# Patient Record
Sex: Female | Born: 1970 | Race: Black or African American | Hispanic: No | Marital: Single | Smoking: Never smoker
Health system: Southern US, Community
[De-identification: ages and names within clinical notes are randomized; demographics above are authoritative.]

## PROBLEM LIST (undated history)

## (undated) DIAGNOSIS — J45909 Unspecified asthma, uncomplicated: Secondary | ICD-10-CM

---

## 1998-03-20 ENCOUNTER — Other Ambulatory Visit: Admission: RE | Admit: 1998-03-20 | Discharge: 1998-03-20 | Payer: Self-pay | Admitting: Gynecology

## 1999-07-22 ENCOUNTER — Inpatient Hospital Stay (HOSPITAL_COMMUNITY): Admission: AD | Admit: 1999-07-22 | Discharge: 1999-07-22 | Payer: Self-pay | Admitting: Obstetrics & Gynecology

## 1999-10-13 ENCOUNTER — Observation Stay (HOSPITAL_COMMUNITY): Admission: AD | Admit: 1999-10-13 | Discharge: 1999-10-13 | Payer: Self-pay | Admitting: Obstetrics and Gynecology

## 1999-11-15 ENCOUNTER — Inpatient Hospital Stay (HOSPITAL_COMMUNITY): Admission: AD | Admit: 1999-11-15 | Discharge: 1999-11-15 | Payer: Self-pay | Admitting: Obstetrics and Gynecology

## 2000-01-28 ENCOUNTER — Inpatient Hospital Stay (HOSPITAL_COMMUNITY): Admission: AD | Admit: 2000-01-28 | Discharge: 2000-02-01 | Payer: Self-pay | Admitting: Obstetrics and Gynecology

## 2000-02-02 ENCOUNTER — Encounter: Admission: RE | Admit: 2000-02-02 | Discharge: 2000-05-02 | Payer: Self-pay | Admitting: Obstetrics and Gynecology

## 2000-05-04 ENCOUNTER — Encounter: Admission: RE | Admit: 2000-05-04 | Discharge: 2000-08-02 | Payer: Self-pay | Admitting: Obstetrics and Gynecology

## 2000-08-04 ENCOUNTER — Encounter: Admission: RE | Admit: 2000-08-04 | Discharge: 2000-11-02 | Payer: Self-pay | Admitting: Obstetrics and Gynecology

## 2000-11-04 ENCOUNTER — Encounter: Admission: RE | Admit: 2000-11-04 | Discharge: 2000-12-04 | Payer: Self-pay | Admitting: Obstetrics and Gynecology

## 2001-01-29 ENCOUNTER — Emergency Department (HOSPITAL_COMMUNITY): Admission: EM | Admit: 2001-01-29 | Discharge: 2001-01-29 | Payer: Self-pay | Admitting: Internal Medicine

## 2001-02-01 ENCOUNTER — Encounter: Admission: RE | Admit: 2001-02-01 | Discharge: 2001-03-03 | Payer: Self-pay | Admitting: Obstetrics and Gynecology

## 2001-03-04 ENCOUNTER — Encounter: Admission: RE | Admit: 2001-03-04 | Discharge: 2001-04-03 | Payer: Self-pay | Admitting: Obstetrics and Gynecology

## 2001-05-04 ENCOUNTER — Encounter: Admission: RE | Admit: 2001-05-04 | Discharge: 2001-06-03 | Payer: Self-pay | Admitting: Obstetrics and Gynecology

## 2001-06-04 ENCOUNTER — Encounter: Admission: RE | Admit: 2001-06-04 | Discharge: 2001-07-04 | Payer: Self-pay | Admitting: Obstetrics and Gynecology

## 2005-02-07 ENCOUNTER — Other Ambulatory Visit: Admission: RE | Admit: 2005-02-07 | Discharge: 2005-02-07 | Payer: Self-pay | Admitting: Obstetrics and Gynecology

## 2005-02-25 ENCOUNTER — Encounter: Admission: RE | Admit: 2005-02-25 | Discharge: 2005-02-25 | Payer: Self-pay | Admitting: Obstetrics and Gynecology

## 2010-10-17 ENCOUNTER — Encounter: Payer: Self-pay | Admitting: Obstetrics and Gynecology

## 2019-05-25 ENCOUNTER — Other Ambulatory Visit: Payer: Self-pay

## 2019-05-25 ENCOUNTER — Encounter (HOSPITAL_BASED_OUTPATIENT_CLINIC_OR_DEPARTMENT_OTHER): Payer: Self-pay | Admitting: *Deleted

## 2019-05-25 ENCOUNTER — Emergency Department (HOSPITAL_BASED_OUTPATIENT_CLINIC_OR_DEPARTMENT_OTHER)
Admission: EM | Admit: 2019-05-25 | Discharge: 2019-05-26 | Disposition: A | Discharge status: Home / Self Care | Payer: No Typology Code available for payment source | Attending: Emergency Medicine | Admitting: Emergency Medicine

## 2019-05-25 DIAGNOSIS — R600 Localized edema: Secondary | ICD-10-CM

## 2019-05-25 DIAGNOSIS — J45909 Unspecified asthma, uncomplicated: Secondary | ICD-10-CM | POA: Diagnosis not present

## 2019-05-25 DIAGNOSIS — R2242 Localized swelling, mass and lump, left lower limb: Secondary | ICD-10-CM | POA: Insufficient documentation

## 2019-05-25 HISTORY — DX: Unspecified asthma, uncomplicated: J45.909

## 2019-05-25 NOTE — ED Triage Notes (Signed)
Pt reports left foot pain and swelling since Wednesday. Foot appears swollen

## 2019-05-26 ENCOUNTER — Emergency Department (HOSPITAL_BASED_OUTPATIENT_CLINIC_OR_DEPARTMENT_OTHER): Payer: No Typology Code available for payment source

## 2019-05-26 MED ORDER — PREDNISONE 50 MG PO TABS: ORAL_TABLET | ORAL | 0 refills | Status: DC

## 2019-05-26 NOTE — ED Notes (Signed)
X RAY at bedside 

## 2019-05-26 NOTE — ED Notes (Signed)
Updated patient on wait times, thanked for her patience.

## 2019-05-26 NOTE — ED Notes (Signed)
PMS intact before and after. Pt tolerated well. All questions answered. 

## 2019-05-26 NOTE — ED Provider Notes (Signed)
[facility] EMERGENCY DEPARTMENT Provider Note   CSN: [id number] Arrival date & time: 05/25/19  2227     History   Chief Complaint Chief Complaint  Patient presents with  . Leg Swelling    HPI Elizabeth Ellis is a 48 y.o. female.     The history is provided by the patient.  Foot Pain This is a new problem. The current episode started more than 2 days ago. The problem occurs daily. The problem has been gradually worsening. Pertinent negatives include no chest pain and no shortness of breath. The symptoms are aggravated by walking. The symptoms are relieved by rest.  Patient reports left foot pain and swelling No trauma, but she did trip recently may have injured it then.  No fevers or vomiting.  No body aches.  No chest pain or shortness of breath.  No calf pain or tenderness.  She reports long history of bilateral lower extremity swelling usually related to food intake.  However isolated foot swelling is unusual for her. No history of VTE She does not take oral contraceptives  She is a Non-smoker Past Medical History:  Diagnosis Date  . Asthma     There are no active problems to display for this patient.   History reviewed. No pertinent surgical history.   OB History   No obstetric history on file.      Home Medications    Prior to Admission medications   Not on File    Family History No family history on file.  Social History Social History   Tobacco Use  . Smoking status: Never Smoker  . Smokeless tobacco: Never Used  Substance Use Topics  . Alcohol use: Never    Frequency: Never  . Drug use: Never     Allergies   Sulfa antibiotics   Review of Systems Review of Systems  Constitutional: Negative for fever.  Respiratory: Negative for shortness of breath.   Cardiovascular: Negative for chest pain.  Musculoskeletal: Positive for arthralgias.  All other systems reviewed and are negative.    Physical Exam Updated Vital Signs BP  135/86 (BP Location: Left Arm)   Pulse 86   Temp 99.1 F (37.3 C) (Oral)   Resp 18   Ht 1.575 m (5\' 2" )   Wt 81.6 kg   LMP 05/11/2019 (Approximate)   SpO2 100%   BMI 32.92 kg/m   Physical Exam CONSTITUTIONAL: Well developed/well nourished HEAD: Normocephalic/atraumatic EYES: EOMI/PERRL NECK: supple no meningeal signs SPINE/BACK:entire spine nontender CV: S1/S2 noted, no murmurs/rubs/gallops noted LUNGS: Lungs are clear to auscultation bilaterally, no apparent distress ABDOMEN: soft, nontender NEURO: Pt is awake/alert/appropriate, moves all extremitiesx4.  No facial droop.   EXTREMITIES: pulses normal/equal, full ROM Left foot is edematous questionable bruising.  No crepitus. no  Erythema.  There are no puncture wounds noted to the foot or in the webspaces.  Plantar surface is nontender.  Diffuse tenderness to dorsal aspect of left foot No ankle tenderness.  Distal pulses intact.  There is no calf tenderness in either leg.  No tenderness to either calf or  thigh SKIN: warm, color normal PSYCH: no abnormalities of mood noted, alert and oriented to situation   ED Treatments / Results  Labs (all labs ordered are listed, but only abnormal results are displayed) Labs Reviewed - No data to display  EKG None  Radiology Dg Foot Complete Left  Result Date: 05/26/2019 CLINICAL DATA:  Foot pain for 4 days EXAM: LEFT FOOT - COMPLETE 3+ VIEW  COMPARISON:  None. FINDINGS: There is no evidence of fracture or dislocation. There is no evidence of arthropathy. Small plantar calcaneal spur. Dorsal soft tissue swelling IMPRESSION: No acute osseous abnormality Electronically Signed   By: Donavan Foil M.D.   On: 05/26/2019 01:34    Procedures Procedures    Medications Ordered in ED Medications - No data to display   Initial Impression / Assessment and Plan / ED Course  I have reviewed the triage vital signs and the nursing notes.  Pertinent  imaging results that were available during  my care of the patient were reviewed by me and considered in my medical decision making (see chart for details).       2:07 AM  Presents with isolated left foot swelling without known injury. X-ray reviewed and is negative.  There is no erythema or signs of abscess to suggest infection. There is no crepitus. I have low suspicion for DVT as there is no calf tenderness or edema. Potentially an arthritic condition. I feel to reasonable to start on a course of steroids and also keep nonweightbearing for several days and crutches.  She will  referred to sports medicine if no improvement in a week. If there is worsening pain, swelling, redness or swelling into her legs she will need to return to the ER Patient declines pain  Final Clinical Impressions(s) / ED Diagnoses   Final diagnoses:  Edema of left foot    ED Discharge Orders         Ordered    predniSONE (DELTASONE) 50 MG tablet     05/26/19 0202           Ripley Fraise, MD 05/26/19 0207

## 2019-05-31 ENCOUNTER — Other Ambulatory Visit: Payer: Self-pay

## 2019-05-31 ENCOUNTER — Ambulatory Visit (INDEPENDENT_AMBULATORY_CARE_PROVIDER_SITE_OTHER): Payer: No Typology Code available for payment source | Admitting: Family Medicine

## 2019-05-31 ENCOUNTER — Ambulatory Visit: Payer: Self-pay

## 2019-05-31 ENCOUNTER — Encounter: Payer: Self-pay | Admitting: Family Medicine

## 2019-05-31 VITALS — BP 144/85 | Ht 62.0 in | Wt 180.0 lb

## 2019-05-31 DIAGNOSIS — M79672 Pain in left foot: Secondary | ICD-10-CM

## 2019-05-31 MED ORDER — PENNSAID 2 % TD SOLN: 1.0000 "application " | Freq: Two times a day (BID) | TRANSDERMAL | 3 refills | Status: AC

## 2019-05-31 MED ORDER — RAYOS 5 MG PO TBEC: 10.0000 mg | DELAYED_RELEASE_TABLET | Freq: Every day | ORAL | 1 refills | Status: AC

## 2019-05-31 NOTE — Progress Notes (Addendum)
Elizabeth Ellis - 48 y.o. female MRN [id number]  Date of birth: 1971/01/24  SUBJECTIVE:  Including CC & ROS.  Chief Complaint  Patient presents with  . Foot Pain    left foot x 1.5 weeks    Elizabeth Ellis is a 48 y.o. female that is presenting with left foot pain and swelling.  The pain is been ongoing for roughly 1 week.  This is acute in nature.  She denies any specific inciting event.  The pain is most severe over the dorsal midfoot and radiates to the toes.  She was seen in the emergency department and had improvement while she was taking the prednisone.  The pain seemed to return while she was off of this.  She has a history of foot pain in both feet but this seems worse.  She has not been walking on a regular occurrence.  No history of stress fracture.  Unsure if any family history of any autoimmune disease.  No history of gout.  Denies any new or different medications.  Denies any history of blood clots.  She denies any calf pain  Independent review of the left foot x-ray from 8/30 shows no acute abnormality.   Review of Systems  Constitutional: Negative for fever.  HENT: Negative for congestion.   Respiratory: Negative for cough.   Cardiovascular: Negative for chest pain.  Gastrointestinal: Negative for abdominal pain.  Musculoskeletal: Positive for gait problem and joint swelling.  Skin: Negative for color change.  Neurological: Negative for weakness.  Hematological: Negative for adenopathy.    HISTORY: Past Medical, Surgical, Social, and Family History Reviewed & Updated per EMR.   Pertinent Historical Findings include:  Past Medical History:  Diagnosis Date  . Asthma     No past surgical history on file.  Allergies  Allergen Reactions  . Sulfa Antibiotics Photosensitivity    No family history on file.   Social History   Socioeconomic History  . Marital status: Single    Spouse name: Not on file  . Number of children: Not on file  . Years of education:  Not on file  . Highest education level: Not on file  Occupational History  . Not on file  Social Needs  . Financial resource strain: Not on file  . Food insecurity    Worry: Not on file    Inability: Not on file  . Transportation needs    Medical: Not on file    Non-medical: Not on file  Tobacco Use  . Smoking status: Never Smoker  . Smokeless tobacco: Never Used  Substance and Sexual Activity  . Alcohol use: Never    Frequency: Never  . Drug use: Never  . Sexual activity: Not on file  Lifestyle  . Physical activity    Days per week: Not on file    Minutes per session: Not on file  . Stress: Not on file  Relationships  . Social Herbalist on phone: Not on file    Gets together: Not on file    Attends religious service: Not on file    Active member of club or organization: Not on file    Attends meetings of clubs or organizations: Not on file    Relationship status: Not on file  . Intimate partner violence    Fear of current or ex partner: Not on file    Emotionally abused: Not on file    Physically abused: Not on file    Forced  sexual activity: Not on file  Other Topics Concern  . Not on file  Social History Narrative  . Not on file     PHYSICAL EXAM:  VS: BP (!) 144/85   Ht '5\' 2"'  (1.575 m)   Wt 180 lb (81.6 kg)   LMP 05/11/2019 (Approximate)   BMI 32.92 kg/m  Physical Exam Gen: NAD, alert, cooperative with exam, well-appearing ENT: normal lips, normal nasal mucosa,  Eye: normal EOM, normal conjunctiva and lids CV:   +2 pedal pulses   Resp: no accessory muscle use, non-labored,  Skin: no rashes, no areas of induration  Neuro: normal tone, normal sensation to touch Psych:  normal insight, alert and oriented MSK:  Left foot: Obvious soft tissue swelling of the dorsal midfoot. Normal ankle range of motion. Normal toe range of motion. No redness or swelling of the MTP joints. Tenderness to palpation over the tarsometatarsal joints. No  ecchymosis. Neurovascular intact  Limited ultrasound: Left foot:  Obvious soft tissue swelling over the dorsal midfoot.  There is degenerative changes through the tarsometatarsal joints.  But no significant effusion in the joints. Cystic structure in the dorsal midfoot just superior to the tarsometatarsal joint between the second and first metatarsal. No significant synovitis over the first or second MTP joints. No crystalline deposition. No effusion within the ankle joint. Normal-appearing posterior tibialis  Summary: Significant soft tissue swelling throughout the foot and cystic structure change of the dorsal midfoot.  Unclear if this is reactionary versus rheumatologic  Ultrasound and interpretation by Clearance Coots, MD      ASSESSMENT & PLAN:   Left foot pain Acutely having left foot pain and swelling.  Less likely for stress fracture.  Does not appear to be gout related on ultrasound.  Less likely for synovitis with no changes observed on ultrasound.  She does have a cystic structure in the middle the foot and unclear if this is a source or not.  Less likely for blood clot.  She did get improvement with the prednisone previously -Rayos and sample provided. -Uric acid, ANA, ESR and CRP. -Cam walker. - may need to consider doppler or MRI if no improvement.

## 2019-05-31 NOTE — Assessment & Plan Note (Signed)
Acutely having left foot pain and swelling.  Less likely for stress fracture.  Does not appear to be gout related on ultrasound.  Less likely for synovitis with no changes observed on ultrasound.  She does have a cystic structure in the middle the foot and unclear if this is a source or not.  Less likely for blood clot.  She did get improvement with the prednisone previously -Rayos and sample provided. -Uric acid, ANA, ESR and CRP. -Cam walker. - may need to consider doppler or MRI if no improvement.

## 2019-05-31 NOTE — Progress Notes (Signed)
Medication Samples have been provided to the patient.  Drug name: Rayos      Strength: 5 mg        Qty: 2 Boxes  LOT: [id number]  Exp.Date: 01/2020  Dosing instructions: Take 2 tablets at bedtime  The patient has been instructed regarding the correct time, dose, and frequency of taking this medication, including desired effects and most common side effects.   Sherrie George, [place] 9:34 AM 05/31/2019

## 2019-05-31 NOTE — Patient Instructions (Signed)
Nice to meet you I will call you with the results from today  Please take 10 my of the Rayos until we see you back  Please elevate your foot.  I will call you with the results from today   Please send me a message in MyChart with any questions or updates.  Please see me back in 2 weeks.   --Dr. Raeford Razor

## 2019-06-03 LAB — ANA,IFA RA DIAG PNL W/RFLX TIT/PATN
ANA Titer 1: NEGATIVE
Cyclic Citrullin Peptide Ab: 3 units (ref 0–19)
Rheumatoid fact SerPl-aCnc: <10 IU/mL (ref 0.0–13.9)

## 2019-06-03 LAB — SEDIMENTATION RATE: Sed Rate: 26 mm/hr (ref 0–32)

## 2019-06-03 LAB — URIC ACID: Uric Acid: 4.6 mg/dL (ref 2.5–7.1)

## 2019-06-03 LAB — C-REACTIVE PROTEIN: CRP: <1 mg/L (ref 0–10)

## 2019-06-04 ENCOUNTER — Telehealth: Payer: Self-pay | Admitting: Family Medicine

## 2019-06-04 NOTE — Telephone Encounter (Signed)
Left VM for patient. If she calls back please have her speak with a nurse/CMA and inform that her results are normal.   If any questions then please take the best time and phone number to call and I will try to call her back.   Rosemarie Ax, MD [facility] 06/04/2019, 8:22 AM

## 2019-06-14 ENCOUNTER — Ambulatory Visit: Payer: No Typology Code available for payment source | Admitting: Family Medicine

## 2019-06-14 NOTE — Progress Notes (Deleted)
  Elizabeth Ellis - 48 y.o. female MRN [id number]  Date of birth: 1971/08/14  SUBJECTIVE:  Including CC & ROS.  No chief complaint on file.   Elizabeth Ellis is a 48 y.o. female that is  ***.  ***   Review of Systems  HISTORY: Past Medical, Surgical, Social, and Family History Reviewed & Updated per EMR.   Pertinent Historical Findings include:  Past Medical History:  Diagnosis Date  . Asthma     No past surgical history on file.  Allergies  Allergen Reactions  . Sulfa Antibiotics Photosensitivity    No family history on file.   Social History   Socioeconomic History  . Marital status: Single    Spouse name: Not on file  . Number of children: Not on file  . Years of education: Not on file  . Highest education level: Not on file  Occupational History  . Not on file  Social Needs  . Financial resource strain: Not on file  . Food insecurity    Worry: Not on file    Inability: Not on file  . Transportation needs    Medical: Not on file    Non-medical: Not on file  Tobacco Use  . Smoking status: Never Smoker  . Smokeless tobacco: Never Used  Substance and Sexual Activity  . Alcohol use: Never    Frequency: Never  . Drug use: Never  . Sexual activity: Not on file  Lifestyle  . Physical activity    Days per week: Not on file    Minutes per session: Not on file  . Stress: Not on file  Relationships  . Social Herbalist on phone: Not on file    Gets together: Not on file    Attends religious service: Not on file    Active member of club or organization: Not on file    Attends meetings of clubs or organizations: Not on file    Relationship status: Not on file  . Intimate partner violence    Fear of current or ex partner: Not on file    Emotionally abused: Not on file    Physically abused: Not on file    Forced sexual activity: Not on file  Other Topics Concern  . Not on file  Social History Narrative  . Not on file     PHYSICAL EXAM:   VS: There were no vitals taken for this visit. Physical Exam Gen: NAD, alert, cooperative with exam, well-appearing ENT: normal lips, normal nasal mucosa,  Eye: normal EOM, normal conjunctiva and lids CV:  no edema, +2 pedal pulses   Resp: no accessory muscle use, non-labored,  GI: no masses or tenderness, no hernia  Skin: no rashes, no areas of induration  Neuro: normal tone, normal sensation to touch Psych:  normal insight, alert and oriented MSK:  ***      ASSESSMENT & PLAN:   No problem-specific Assessment & Plan notes found for this encounter.

## 2019-06-17 ENCOUNTER — Encounter: Payer: Self-pay | Admitting: Family Medicine

## 2019-06-17 ENCOUNTER — Other Ambulatory Visit: Payer: Self-pay

## 2019-06-17 ENCOUNTER — Ambulatory Visit (INDEPENDENT_AMBULATORY_CARE_PROVIDER_SITE_OTHER): Payer: No Typology Code available for payment source | Admitting: Family Medicine

## 2019-06-17 VITALS — BP 126/77 | Ht 62.0 in | Wt 180.0 lb

## 2019-06-17 DIAGNOSIS — M79672 Pain in left foot: Secondary | ICD-10-CM | POA: Diagnosis not present

## 2019-06-17 NOTE — Progress Notes (Signed)
Elizabeth Ellis - 48 y.o. female MRN [id number]  Date of birth: 1970/11/29  SUBJECTIVE:  Including CC & ROS.  Chief Complaint  Patient presents with  . Follow-up    follow up for left foot    Elizabeth Ellis is a 48 y.o. female that is following up for her left dorsal foot pain.  Pain is still ongoing.  The pain is gotten somewhat improved with the cam walker.  She was initially walking when the pain occurred.  She also had an injury a few years ago.  There was swelling as well as significant pain on the dorsal aspect with any walking prior to her in the cam walker.  Has some shooting radiating pain distally to the toes.  Has been out of the cam walker for a few hours around the house.  No ecchymosis.  Still has swelling intermittently.   Review of Systems  Constitutional: Negative for fever.  HENT: Negative for congestion.   Respiratory: Negative for cough.   Cardiovascular: Negative for chest pain.  Gastrointestinal: Negative for abdominal pain.  Musculoskeletal: Positive for gait problem.  Skin: Negative for color change.  Neurological: Negative for weakness.  Hematological: Negative for adenopathy.    HISTORY: Past Medical, Surgical, Social, and Family History Reviewed & Updated per EMR.   Pertinent Historical Findings include:  Past Medical History:  Diagnosis Date  . Asthma     No past surgical history on file.  Allergies  Allergen Reactions  . Sulfa Antibiotics Photosensitivity    No family history on file.   Social History   Socioeconomic History  . Marital status: Single    Spouse name: Not on file  . Number of children: Not on file  . Years of education: Not on file  . Highest education level: Not on file  Occupational History  . Not on file  Social Needs  . Financial resource strain: Not on file  . Food insecurity    Worry: Not on file    Inability: Not on file  . Transportation needs    Medical: Not on file    Non-medical: Not on file  Tobacco  Use  . Smoking status: Never Smoker  . Smokeless tobacco: Never Used  Substance and Sexual Activity  . Alcohol use: Never    Frequency: Never  . Drug use: Never  . Sexual activity: Not on file  Lifestyle  . Physical activity    Days per week: Not on file    Minutes per session: Not on file  . Stress: Not on file  Relationships  . Social Musician on phone: Not on file    Gets together: Not on file    Attends religious service: Not on file    Active member of club or organization: Not on file    Attends meetings of clubs or organizations: Not on file    Relationship status: Not on file  . Intimate partner violence    Fear of current or ex partner: Not on file    Emotionally abused: Not on file    Physically abused: Not on file    Forced sexual activity: Not on file  Other Topics Concern  . Not on file  Social History Narrative  . Not on file     PHYSICAL EXAM:  VS: BP 126/77   Ht 5\' 2"  (1.575 m)   Wt 180 lb (81.6 kg)   BMI 32.92 kg/m  Physical Exam Gen: NAD, alert, cooperative  with exam, well-appearing ENT: normal lips, normal nasal mucosa,  Eye: normal EOM, normal conjunctiva and lids CV:  no edema, +2 pedal pulses   Resp: no accessory muscle use, non-labored,  Skin: no rashes, no areas of induration  Neuro: normal tone, normal sensation to touch Psych:  normal insight, alert and oriented MSK:  Left foot:  Mild dorsal midfoot swelling  Tenderness to palpation over the dorsal midfoot. Some specific point tenderness over the cuneiform bones. Normal ankle range of motion. Normal strength resistance. Neurovascular intact     ASSESSMENT & PLAN:   Left foot pain Initial injury was prior to 8/27.  Was hiking and did have an injury.  Also had an injury 2 years ago.  Independent review of lab work from 9/4 does not suggest gout or an autoimmune process. -Counseled on supportive care. -MRI to evaluate for occult fracture versus stress fracture given  ongoing pain and swelling with cam walker.

## 2019-06-17 NOTE — Patient Instructions (Signed)
Good to see you Please continue the CAM walker.  Please avoid walking barefoot  Please continue tylenol as needed   You will get a call to schedule the MRI Please send me a message in MyChart with any questions or updates.  We will call to schedule a virtual visit once we have the results .   --Dr. Raeford Razor

## 2019-06-17 NOTE — Assessment & Plan Note (Signed)
Initial injury was prior to 8/27.  Was hiking and did have an injury.  Also had an injury 2 years ago.  Independent review of lab work from 9/4 does not suggest gout or an autoimmune process. -Counseled on supportive care. -MRI to evaluate for occult fracture versus stress fracture given ongoing pain and swelling with cam walker.

## 2019-06-23 ENCOUNTER — Ambulatory Visit
Admission: RE | Admit: 2019-06-23 | Discharge: 2019-06-23 | Disposition: A | Discharge status: Home / Self Care | Payer: No Typology Code available for payment source | Source: Ambulatory Visit | Attending: Family Medicine | Admitting: Family Medicine

## 2019-06-23 ENCOUNTER — Other Ambulatory Visit: Payer: Self-pay

## 2019-06-23 DIAGNOSIS — M79672 Pain in left foot: Secondary | ICD-10-CM

## 2019-06-27 ENCOUNTER — Other Ambulatory Visit: Payer: Self-pay

## 2019-06-27 ENCOUNTER — Ambulatory Visit (INDEPENDENT_AMBULATORY_CARE_PROVIDER_SITE_OTHER): Payer: No Typology Code available for payment source | Admitting: Family Medicine

## 2019-06-27 DIAGNOSIS — M79672 Pain in left foot: Secondary | ICD-10-CM | POA: Diagnosis not present

## 2019-06-27 MED ORDER — COLCHICINE 0.6 MG PO TABS: 0.6000 mg | ORAL_TABLET | Freq: Two times a day (BID) | ORAL | 0 refills | Status: AC

## 2019-06-27 NOTE — Progress Notes (Signed)
Virtual Visit via Video Note  I connected with Elizabeth Ellis on 06/27/19 at  8:30 AM EDT by a video enabled telemedicine application and verified that I am speaking with the correct person using two identifiers.   I discussed the limitations of evaluation and management by telemedicine and the availability of in person appointments. The patient expressed understanding and agreed to proceed.  History of Present Illness:  Elizabeth Ellis is a 48 year old female that is following up for her left foot pain after her MRI.  The MRI was revealing for degenerative changes that were most present at the second tarsometatarsal joint.  There is also a cystic structure in this area that likely represents a ganglion cyst.  There is no evidence of stress fracture or infection.  I did demonstrate subcutaneous edema.  She has been wearing the cam walker and has noticed improvement of her symptoms.  She does have Rayos that seems to help her symptoms as well.   Observations/Objective:  Gen: NAD, alert, well-appearing ENT: normal lips, normal nasal mucosa,  Eye: normal EOM, normal conjunctiva and lids Resp: no accessory muscle use, non-labored,  Skin: no rashes, no areas of induration     Assessment and Plan:  Symptoms are ongoing and still has swelling.  Has been trying the Rayos and the cam walker.  MRI was revealing for arthritis in 1 joint but no stress fracture or infection.  Possible to still could be related to gout versus complex regional pain syndrome. -We will try to wean out of cam walker. -Stop the Rayos and initiate colchicine.  Take this for 1 week.  If no improvement will consider gabapentin to treat for complex regional pain syndrome.  Follow Up Instructions:    I discussed the assessment and treatment plan with the patient. The patient was provided an opportunity to ask questions and all were answered. The patient agreed with the plan and demonstrated an understanding of the instructions.    The patient was advised to call back or seek an in-person evaluation if the symptoms worsen or if the condition fails to improve as anticipated.    Clearance Coots, MD

## 2019-06-27 NOTE — Assessment & Plan Note (Addendum)
Symptoms are ongoing and still has swelling.  Has been trying the Rayos and the cam walker.  MRI was revealing for arthritis in 1 joint but no stress fracture or infection.  Possible to still could be related to gout versus complex regional pain syndrome. -We will try to wean out of cam walker. -Stop the Rayos and initiate colchicine.  Take this for 1 week.  If no improvement will consider gabapentin to treat for complex regional pain syndrome.

## 2020-08-15 IMAGING — MR MR FOOT*L* W/O CM
5 series · 40 of 40 positions shown · non-contrast
Comparison: X-ray 05/26/2019, ultrasound 05/31/2019

CLINICAL DATA: Dorsal foot pain for 1 year.  No known injury

EXAM:
MRI OF THE LEFT FOOT WITHOUT CONTRAST
TECHNIQUE: Multiplanar, multisequence MR imaging of the left foot was
performed. No intravenous contrast was administered.

[Series 4: T1 · coronal · left · 3.0mm · 0.38mm/px · 10 of 44 slices shown (1 of 2)]
[im 1/44]
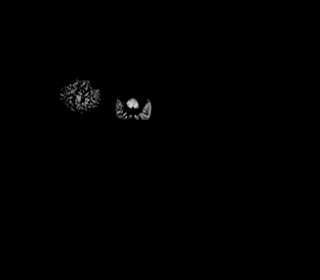
[im 5/44]
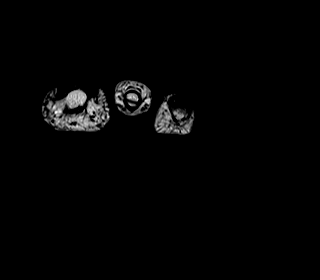
[im 10/44]
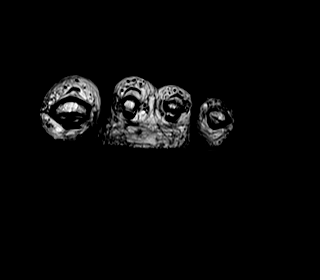
[im 15/44]
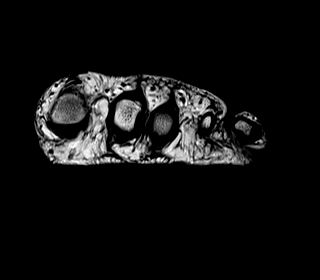
[im 20/44]
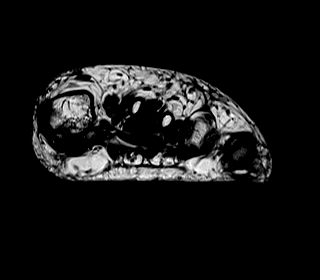
[im 24/44]
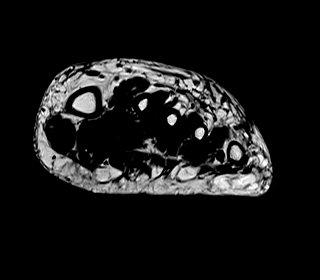
[im 29/44]
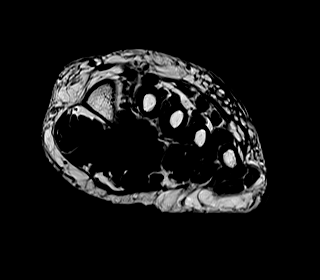
[im 34/44]
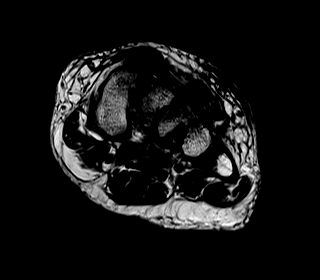
[im 39/44]
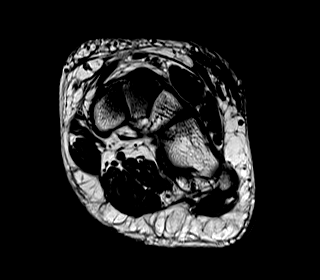
[im 44/44]
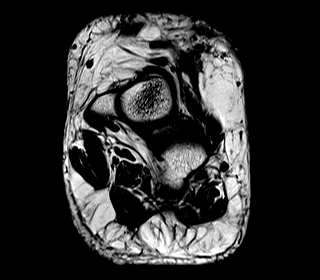

[Series 6: T2 fat-sat · coronal · left · 3.0mm · 0.38mm/px · 11 of 44 slices shown (1 of 2)]
[im 1/44]
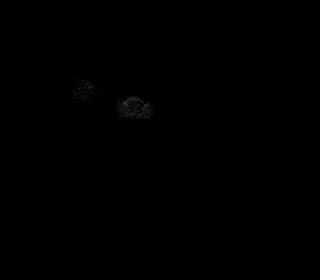
[im 5/44]
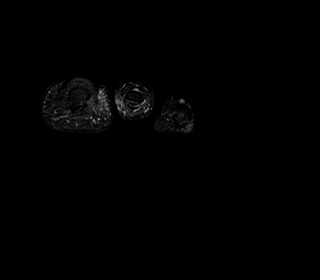
[im 9/44]
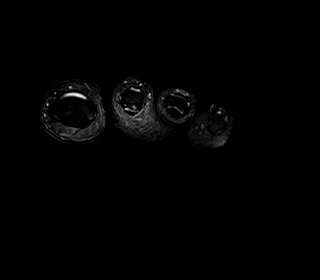
[im 13/44]
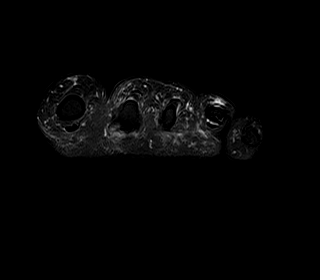
[im 18/44]
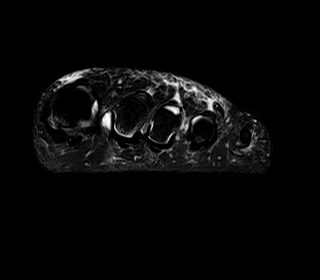
[im 22/44]
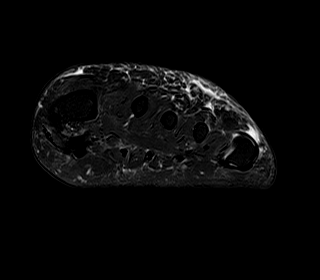
[im 26/44]
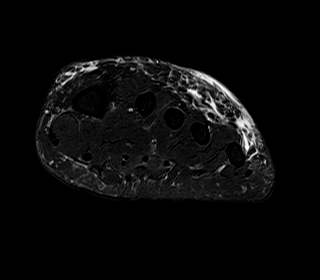
[im 31/44]
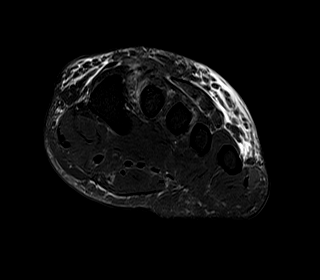
[im 35/44]
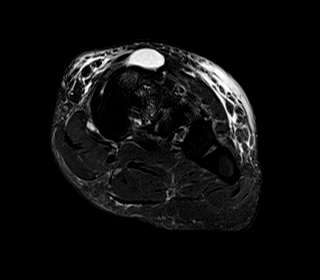
[im 39/44]
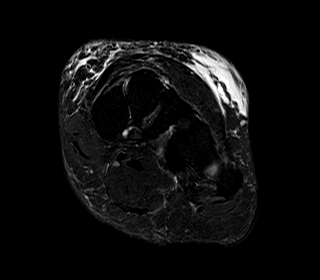
[im 44/44]
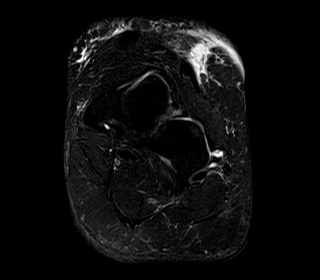

[Series 7: T1 · axial · left · 3.0mm · 0.47mm/px · z∈[-127,-54]mm · 6 of 23 slices shown (2 of 2)]
[im 1/23]
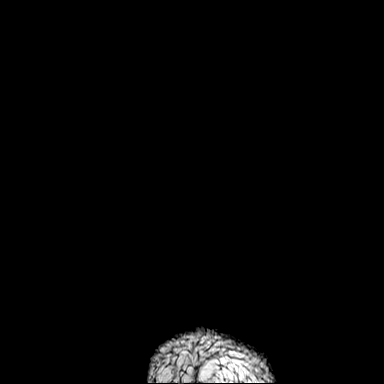
[im 5/23]
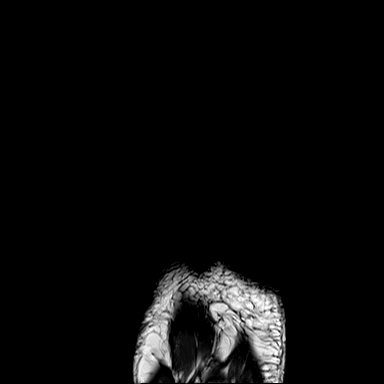
[im 9/23]
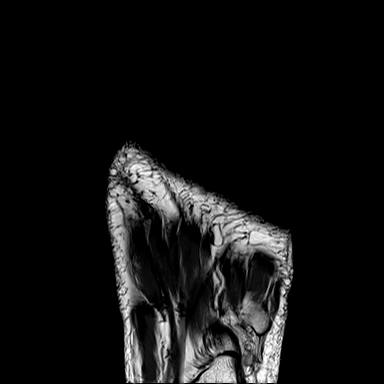
[im 14/23]
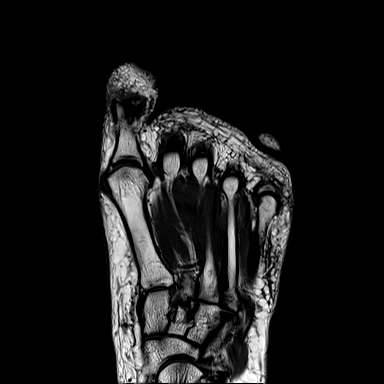
[im 18/23]
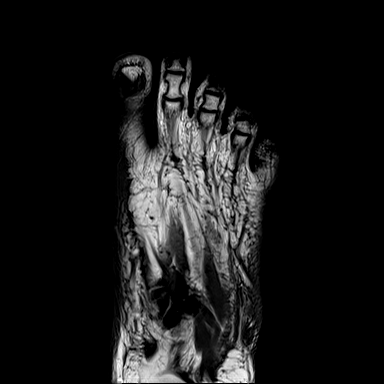
[im 23/23]
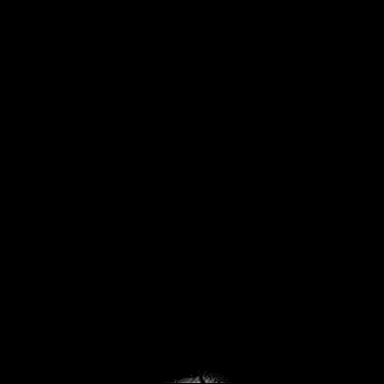

[Series 8: T2 fat-sat · axial · left · 3.0mm · 0.47mm/px · z∈[-127,-54]mm · 6 of 23 slices shown (2 of 2)]
[im 1/23]
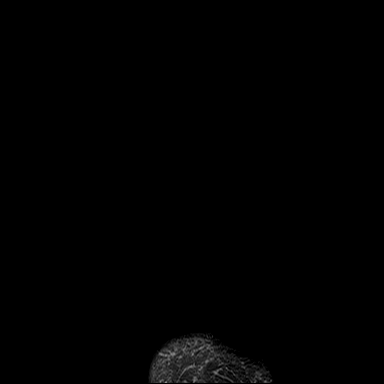
[im 5/23]
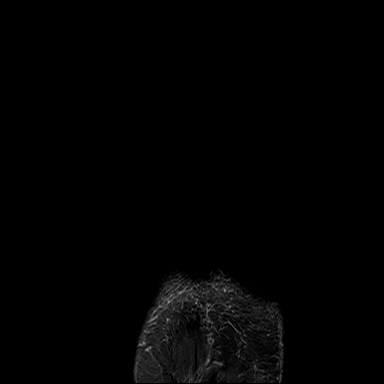
[im 9/23]
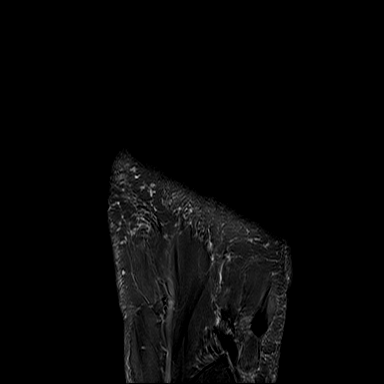
[im 14/23]
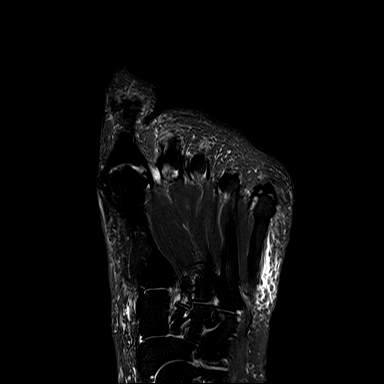
[im 18/23]
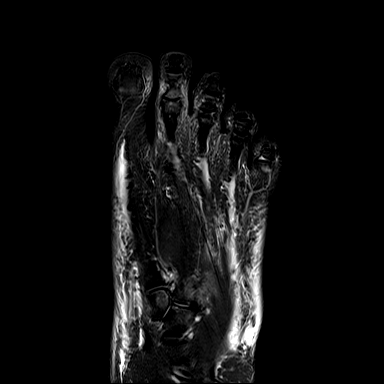
[im 23/23]
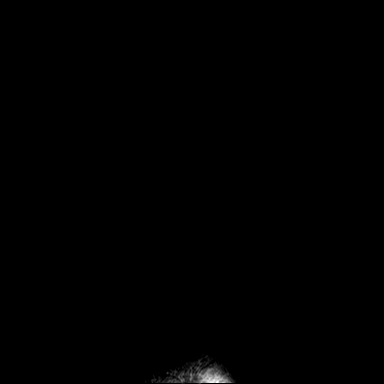

[Series 9: STIR · sagittal · left · 3.0mm · 0.56mm/px · 7 of 27 slices shown]
[im 1/27]
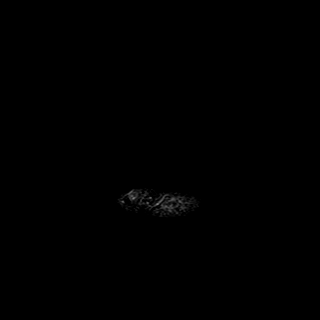
[im 5/27]
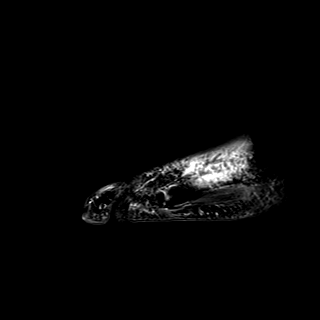
[im 9/27]
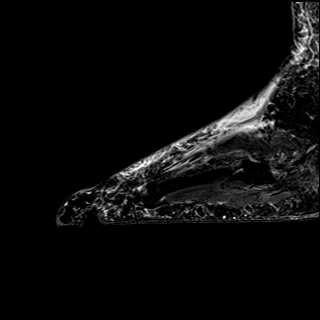
[im 14/27]
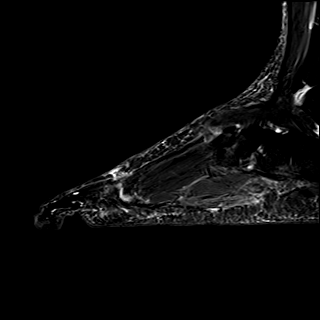
[im 18/27]
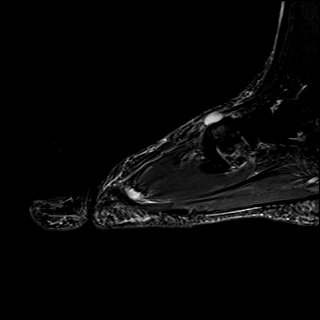
[im 22/27]
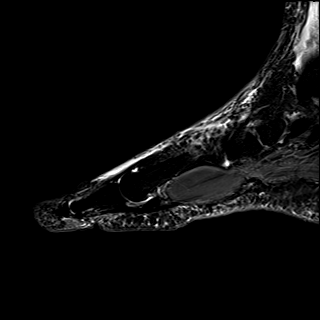
[im 27/27]
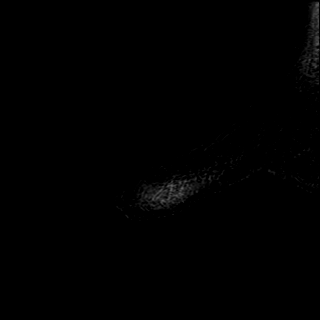

[40 of 40 positions shown; findings below may reference images not displayed]

FINDINGS: Bones/Joint/Cartilage

No acute fracture or malalignment. No cortical thickening or
evidence of stress fracture. Mild-to-moderate degenerative changes
at the second and third tarsometatarsal joints with associated
reactive subchondral marrow changes. Milder degenerative changes at
the first and fourth TMT joint as well as the intercuneiform joints.

Ligaments

Intrinsic foot ligaments including the Lisfranc ligament are intact.

Muscles and Tendons

Normal muscle bulk and signal without atrophy or fatty infiltration.
Tendons intact. No tenosynovial fluid.

Soft tissues

Well-circumscribed T1 hypointense, T2 hyperintense ovoid structure
dorsal to the second tarsometatarsal joint measuring 1.6 x 0.9 x
cm (series 6, image 35; series 9, image 12). Subcutaneous edema
overlies the dorsum of the midfoot and forefoot.
IMPRESSION: 1. No acute osseous abnormality or evidence of stress fracture, as
clinically questioned.
2. Degenerative changes most pronounced at the second TMT joint
where there is a well-circumscribed 1.6 cm cystic structure in the
adjacent dorsal soft tissues, most likely representing ganglion
cyst.
3. Nonspecific subcutaneous edema over the dorsal midfoot and
forefoot.

## 2023-01-11 ENCOUNTER — Encounter: Payer: Self-pay | Admitting: *Deleted
